# Patient Record
Sex: Female | Born: 2000
Health system: Southern US, Community
[De-identification: ages and names within clinical notes are randomized; demographics above are authoritative.]

## PROBLEM LIST (undated history)

## (undated) DIAGNOSIS — R109 Unspecified abdominal pain: Secondary | ICD-10-CM

## (undated) DIAGNOSIS — T7840XA Allergy, unspecified, initial encounter: Secondary | ICD-10-CM

## (undated) HISTORY — PX: OTHER SURGICAL HISTORY: SHX169

## (undated) HISTORY — DX: Allergy, unspecified, initial encounter: T78.40XA

## (undated) HISTORY — DX: Unspecified abdominal pain: R10.9

---
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
## 2021-02-20 ENCOUNTER — Encounter: Payer: Self-pay | Admitting: Family

## 2021-02-20 ENCOUNTER — Other Ambulatory Visit: Payer: Self-pay

## 2021-02-20 ENCOUNTER — Telehealth: Payer: Self-pay

## 2021-02-20 NOTE — Telephone Encounter (Signed)
DPL sent two links for Mychart Video appointment on 02/20/2021 with no response. Called patient twice and LM

## 2021-02-21 NOTE — Progress Notes (Signed)
This encounter was created in error - please disregard.

## 2021-02-28 ENCOUNTER — Telehealth: Payer: BC Managed Care – PPO | Admitting: Family

## 2021-03-14 DIAGNOSIS — L03317 Cellulitis of buttock: Secondary | ICD-10-CM | POA: Diagnosis not present

## 2021-04-10 ENCOUNTER — Encounter: Payer: Self-pay | Admitting: Family Medicine

## 2021-04-10 ENCOUNTER — Other Ambulatory Visit: Payer: Self-pay

## 2021-04-10 ENCOUNTER — Ambulatory Visit (INDEPENDENT_AMBULATORY_CARE_PROVIDER_SITE_OTHER): Payer: BC Managed Care – PPO | Admitting: Family Medicine

## 2021-04-10 VITALS — BP 101/68 | HR 77 | Temp 98.3°F | Resp 16 | Ht 67.0 in | Wt 165.0 lb

## 2021-04-10 DIAGNOSIS — R109 Unspecified abdominal pain: Secondary | ICD-10-CM | POA: Diagnosis not present

## 2021-04-10 DIAGNOSIS — R140 Abdominal distension (gaseous): Secondary | ICD-10-CM

## 2021-04-10 NOTE — Assessment & Plan Note (Signed)
Chronic, intermittent. Obtain labs to assess for underlying pathology. Instructed on avoiding triggers and low FODMAP diet. Encouraged to keep food diary as well as documenting sleep and stress levels. F/u in 4 weeks. If labs unrevealing with persistent sx, consider GI referral.

## 2021-04-10 NOTE — Patient Instructions (Signed)
Low-FODMAP Eating Plan °FODMAP stands for fermentable oligosaccharides, disaccharides, monosaccharides, and polyols. These are sugars that are hard for some people to digest. A low-FODMAP eating plan may help some people who have irritable bowel syndrome (IBS) and certain other bowel (intestinal) diseases to manage their symptoms. °This meal plan can be complicated to follow. Work with a diet and nutrition specialist (dietitian) to make a low-FODMAP eating plan that is right for you. A dietitian can help make sure that you get enough nutrition from this diet. °What are tips for following this plan? °Reading food labels °Check labels for hidden FODMAPs such as: °High-fructose syrup. °Honey. °Agave. °Natural fruit flavors. °Onion or garlic powder. °Choose low-FODMAP foods that contain 3-4 grams of fiber per serving. °Check food labels for serving sizes. Eat only one serving at a time to make sure FODMAP levels stay low. °Shopping °Shop with a list of foods that are recommended on this diet and make a meal plan. °Meal planning °Follow a low-FODMAP eating plan for up to 6 weeks, or as told by your health care provider or dietitian. °To follow the eating plan: °Eliminate high-FODMAP foods from your diet completely. Choose only low-FODMAP foods to eat. You will do this for 2-6 weeks. °Gradually reintroduce high-FODMAP foods into your diet one at a time. Most people should wait a few days before introducing the next new high-FODMAP food into their meal plan. Your dietitian can recommend how quickly you may reintroduce foods. °Keep a daily record of what and how much you eat and drink. Make note of any symptoms that you have after eating. °Review your daily record with a dietitian regularly to identify which foods you can eat and which foods you should avoid. °General tips °Drink enough fluid each day to keep your urine pale yellow. °Avoid processed foods. These often have added sugar and may be high in FODMAPs. °Avoid most  dairy products, whole grains, and sweeteners. °Work with a dietitian to make sure you get enough fiber in your diet. °Avoid high FODMAP foods at meals to manage symptoms. °Recommended foods °Fruits °Bananas, oranges, tangerines, lemons, limes, blueberries, raspberries, strawberries, grapes, cantaloupe, honeydew melon, kiwi, papaya, passion fruit, and pineapple. Limited amounts of dried cranberries, banana chips, and shredded coconut. °Vegetables °Eggplant, zucchini, cucumber, peppers, green beans, bean sprouts, lettuce, arugula, kale, Swiss chard, spinach, collard greens, bok choy, summer squash, potato, and tomato. Limited amounts of corn, carrot, and sweet potato. Green parts of scallions. °Grains °Gluten-free grains, such as rice, oats, buckwheat, quinoa, corn, polenta, and millet. Gluten-free pasta, bread, or cereal. Rice noodles. Corn tortillas. °Meats and other proteins °Unseasoned beef, pork, poultry, or fish. Eggs. Bacon. Tofu (firm) and tempeh. Limited amounts of nuts and seeds, such as almonds, walnuts, brazil nuts, pecans, peanuts, nut butters, pumpkin seeds, chia seeds, and sunflower seeds. °Dairy °Lactose-free milk, yogurt, and kefir. Lactose-free cottage cheese and ice cream. Non-dairy milks, such as almond, coconut, hemp, and rice milk. Non-dairy yogurt. Limited amounts of goat cheese, brie, mozzarella, parmesan, swiss, and other hard cheeses. °Fats and oils °Butter-free spreads. Vegetable oils, such as olive, canola, and sunflower oil. °Seasoning and other foods °Artificial sweeteners with names that do not end in "ol," such as aspartame, saccharine, and stevia. Maple syrup, white table sugar, raw sugar, brown sugar, and molasses. Mayonnaise, soy sauce, and tamari. Fresh basil, coriander, parsley, rosemary, and thyme. °Beverages °Water and mineral water. Sugar-sweetened soft drinks. Small amounts of orange juice or cranberry juice. Black and green tea. Most dry wines. Coffee. °  The items listed above  may not be a complete list of foods and beverages you can eat. Contact a dietitian for more information. °Foods to avoid °Fruits °Fresh, dried, and juiced forms of apple, pear, watermelon, peach, plum, cherries, apricots, blackberries, boysenberries, figs, nectarines, and mango. Avocado. °Vegetables °Chicory root, artichoke, asparagus, cabbage, snow peas, Brussels sprouts, broccoli, sugar snap peas, mushrooms, celery, and cauliflower. Onions, garlic, leeks, and the white part of scallions. °Grains °Wheat, including kamut, durum, and semolina. Barley and bulgur. Couscous. Wheat-based cereals. Wheat noodles, bread, crackers, and pastries. °Meats and other proteins °Fried or fatty meat. Sausage. Cashews and pistachios. Soybeans, baked beans, black beans, chickpeas, kidney beans, fava beans, navy beans, lentils, black-eyed peas, and split peas. °Dairy °Milk, yogurt, ice cream, and soft cheese. Cream and sour cream. Milk-based sauces. Custard. Buttermilk. Soy milk. °Seasoning and other foods °Any sugar-free gum or candy. Foods that contain artificial sweeteners such as sorbitol, mannitol, isomalt, or xylitol. Foods that contain honey, high-fructose corn syrup, or agave. Bouillon, vegetable stock, beef stock, and chicken stock. Garlic and onion powder. Condiments made with onion, such as hummus, chutney, pickles, relish, salad dressing, and salsa. Tomato paste. °Beverages °Chicory-based drinks. Coffee substitutes. Chamomile tea. Fennel tea. Sweet or fortified wines such as port or sherry. Diet soft drinks made with isomalt, mannitol, maltitol, sorbitol, or xylitol. Apple, pear, and mango juice. Juices with high-fructose corn syrup. °The items listed above may not be a complete list of foods and beverages you should avoid. Contact a dietitian for more information. °Summary °FODMAP stands for fermentable oligosaccharides, disaccharides, monosaccharides, and polyols. These are sugars that are hard for some people to  digest. °A low-FODMAP eating plan is a short-term diet that helps to ease symptoms of certain bowel diseases. °The eating plan usually lasts up to 6 weeks. After that, high-FODMAP foods are reintroduced gradually and one at a time. This can help you find out which foods may be causing symptoms. °A low-FODMAP eating plan can be complicated. It is best to work with a dietitian who has experience with this type of plan. °This information is not intended to replace advice given to you by your health care provider. Make sure you discuss any questions you have with your health care provider. °Document Revised: 08/06/2019 Document Reviewed: 08/06/2019 °Elsevier Patient Education © 2022 Elsevier Inc. ° °

## 2021-04-10 NOTE — Progress Notes (Signed)
° °  SUBJECTIVE:   CHIEF COMPLAINT / HPI:   ABDOMINAL ISSUES  Duration:  5-6 years Nature: bloating Location: diffuse  Frequency: intermittent, occurring at least 3x per week.  Alleviating factors: herbal teas Aggravating factors:dairy, bread, sugars Treatments attempted: herbal teas Constipation: no Diarrhea: no 1 BM per day, "normal" Episodes of diarrhea/day: Mucous in the stool: no Heartburn: occasional Bloating:yes Flatulence: no Nausea: yes Vomiting: no Episodes of vomit/day: Melena or hematochezia: no Rash: no Jaundice: no Fever: no Weight loss: no No FH of GI issues.  No abdominal surgeries.  Previously on prilosec for acid reflux.  OBJECTIVE:   BP 101/68 (BP Location: Right Arm, Patient Position: Sitting, Cuff Size: Normal)    Pulse 77    Temp 98.3 F (36.8 C) (Temporal)    Resp 16    Ht 5\' 7"  (1.702 m)    Wt 165 lb (74.8 kg)    LMP 04/10/2021    SpO2 99%    BMI 25.84 kg/m   Gen: well appearing, in NAD Card: RRR Lungs: CTAB Abd: soft, NTND, +BS. Negative murphy sign. No organomegaly.  Ext: WWP, no edema   ASSESSMENT/PLAN:   Abdominal bloating with cramps Chronic, intermittent. Obtain labs to assess for underlying pathology. Instructed on avoiding triggers and low FODMAP diet. Encouraged to keep food diary as well as documenting sleep and stress levels. F/u in 4 weeks. If labs unrevealing with persistent sx, consider GI referral.      Myles Gip, DO

## 2021-04-12 LAB — COMPREHENSIVE METABOLIC PANEL
ALT: 21 IU/L (ref 0–32)
AST: 24 IU/L (ref 0–40)
Albumin/Globulin Ratio: 2 (ref 1.2–2.2)
Albumin: 4.3 g/dL (ref 3.9–5.0)
Alkaline Phosphatase: 74 IU/L (ref 42–106)
BUN/Creatinine Ratio: 21 (ref 9–23)
BUN: 13 mg/dL (ref 6–20)
Bilirubin Total: 0.2 mg/dL (ref 0.0–1.2)
CO2: 25 mmol/L (ref 20–29)
Calcium: 9.2 mg/dL (ref 8.7–10.2)
Chloride: 102 mmol/L (ref 96–106)
Creatinine, Ser: 0.62 mg/dL (ref 0.57–1.00)
Globulin, Total: 2.2 g/dL (ref 1.5–4.5)
Glucose: 79 mg/dL (ref 70–99)
Potassium: 4.2 mmol/L (ref 3.5–5.2)
Sodium: 140 mmol/L (ref 134–144)
Total Protein: 6.5 g/dL (ref 6.0–8.5)
eGFR: 131 mL/min/{1.73_m2} (ref >59)

## 2021-04-12 LAB — CBC WITH DIFFERENTIAL/PLATELET
Basophils Absolute: 0 10*3/uL (ref 0.0–0.2)
Basos: 1 %
EOS (ABSOLUTE): 0.2 10*3/uL (ref 0.0–0.4)
Eos: 4 %
Hematocrit: 38.1 % (ref 34.0–46.6)
Hemoglobin: 12.2 g/dL (ref 11.1–15.9)
Immature Grans (Abs): 0 10*3/uL (ref 0.0–0.1)
Immature Granulocytes: 0 %
Lymphocytes Absolute: 1.3 10*3/uL (ref 0.7–3.1)
Lymphs: 32 %
MCH: 25.2 pg — ABNORMAL LOW (ref 26.6–33.0)
MCHC: 32 g/dL (ref 31.5–35.7)
MCV: 79 fL (ref 79–97)
Monocytes Absolute: 0.4 10*3/uL (ref 0.1–0.9)
Monocytes: 10 %
Neutrophils Absolute: 2.2 10*3/uL (ref 1.4–7.0)
Neutrophils: 53 %
Platelets: 228 10*3/uL (ref 150–450)
RBC: 4.84 x10E6/uL (ref 3.77–5.28)
RDW: 16.6 % — ABNORMAL HIGH (ref 11.7–15.4)
WBC: 4.2 10*3/uL (ref 3.4–10.8)

## 2021-04-12 LAB — CELIAC DISEASE PANEL
Endomysial IgA: NEGATIVE
IgA/Immunoglobulin A, Serum: 121 mg/dL (ref 87–352)
Transglutaminase IgA: <2 U/mL (ref 0–3)

## 2021-04-12 LAB — H. PYLORI BREATH TEST: H pylori Breath Test: NEGATIVE

## 2021-04-12 LAB — TSH: TSH: 0.568 u[IU]/mL (ref 0.450–4.500)

## 2021-04-14 ENCOUNTER — Other Ambulatory Visit: Payer: Self-pay

## 2021-04-14 MED ORDER — METHYLPHENIDATE HCL ER (OSM) 18 MG PO TBCR: EXTENDED_RELEASE_TABLET | ORAL | 0 refills | Status: DC

## 2021-04-14 NOTE — Telephone Encounter (Signed)
Concerta 18 mg daily, # 30 with no RF's.RX for above e-scribed and sent to pharmacy on record  [facility] - [place], [place] - [address] [place] [address] Phone: [phone] Fax: [phone]      

## 2021-04-25 ENCOUNTER — Encounter: Payer: Self-pay | Admitting: Family

## 2021-04-25 ENCOUNTER — Telehealth (INDEPENDENT_AMBULATORY_CARE_PROVIDER_SITE_OTHER): Payer: BC Managed Care – PPO | Admitting: Family

## 2021-04-25 ENCOUNTER — Other Ambulatory Visit: Payer: Self-pay

## 2021-04-25 DIAGNOSIS — F819 Developmental disorder of scholastic skills, unspecified: Secondary | ICD-10-CM

## 2021-04-25 DIAGNOSIS — Z719 Counseling, unspecified: Secondary | ICD-10-CM

## 2021-04-25 DIAGNOSIS — F900 Attention-deficit hyperactivity disorder, predominantly inattentive type: Secondary | ICD-10-CM | POA: Diagnosis not present

## 2021-04-25 DIAGNOSIS — F411 Generalized anxiety disorder: Secondary | ICD-10-CM

## 2021-04-25 DIAGNOSIS — Z79899 Other long term (current) drug therapy: Secondary | ICD-10-CM | POA: Diagnosis not present

## 2021-04-25 DIAGNOSIS — Z8659 Personal history of other mental and behavioral disorders: Secondary | ICD-10-CM | POA: Diagnosis not present

## 2021-04-25 DIAGNOSIS — Z7189 Other specified counseling: Secondary | ICD-10-CM

## 2021-04-25 NOTE — Progress Notes (Signed)
Sandra Simmons [place]. [address] [place] [address] Dept: [phone] Dept Fax: [phone]  Medication Check visit via Virtual Video   Patient ID:  Sandra Simmons  female DOB: Dec 22, 2000   21 y.o.   MRN: [id number]   DATE:04/25/21  PCP: Virginia Crews, MD  Virtual Visit via Video Note  I connected with  Sandra Simmons on 04/25/21 at  9:30 AM EST by a video enabled telemedicine application and verified that I am speaking with the correct person using two identifiers. Patient/Parent Location: at home   I discussed the limitations, risks, security and privacy concerns of performing an evaluation and management service by telephone and the availability of in person appointments. I also discussed with the parents that there may be a patient responsible charge related to this service. The parents expressed understanding and agreed to proceed.  Provider: Carolann Littler, NP  Location: work location  HPI/CURRENT STATUS: Sandra Simmons is here for medication management of the psychoactive medications for ADHD and review of educational and behavioral concerns.   Sandra Simmons currently taking Concerta 18 mg daily, which is working well. Takes medication in the morning before class. Medication tends to wear off around the evening time. Sandra Simmons is able to focus through school/homework.   Sandra Simmons is eating well (eating breakfast, lunch and dinner). Sandra Simmons does not have appetite suppression and getting enough calories in each day.   Sleeping well (goes to bed at 12:00 am wakes at 7-9:00 am), sleeping through the night. Sandra Simmons does not have delayed sleep onset  EDUCATION: School: [employer] Year/Grade:  College   Performance/ Grades:  GPA 3.7 with 18 hours Services: Other: none reported. Tutoring for science and math  Activities/ Exercise: Dance club for several hours each week and showcase coming up. Dance class last semester with  healthcare professions club on campus. Gym in the morning daily.   MEDICAL HISTORY: Individual Medical History/ Review of Systems: Yes, PCP with blood work with allergy test. Needing to take Fe+ 3 times weekly.   Has been healthy with no visits to the PCP. Well check up due yearly.   Family Medical/ Social History: Changes? None reported Patient Lives with:  3 roommates  MENTAL HEALTH: Mental Health Issues: Anxiety-less now and more organized   Allergies: No Known Allergies  Current Medications:  Current Outpatient Medications  Medication Instructions   drospirenone-ethinyl estradiol (YAZ) 3-0.02 MG tablet 1 tablet, Oral, Daily   hydrocortisone 1 % ointment 1 application, 2 times daily   methylphenidate (RITALIN) 5 mg, Oral, Every evening   methylphenidate 18 MG PO CR tablet TAKE 1 TABLET BY MOUTH ONCE EVERY MORNING   tazarotene (AVAGE) 0.1 % cream Topical   Medication Side Effects: None  DIAGNOSES:    ICD-10-CM   1. ADHD (attention deficit hyperactivity disorder), inattentive type  F90.0     2. GAD (generalized anxiety disorder)  F41.1     3. History of depression  Z86.59     4. Medication management  Z79.899     5. Learning difficulty  F81.9     6. Patient counseled  Z71.9     7. Goals of care, counseling/discussion  Z71.89      ASSESSMENT:  Zaphira is a 21 year old female with a history of ADHD, Anxiety and learning difficulties. She has been well controlled on her Concerta 18 mg daily with using the Ritalin 5 mg in the afternoon. Anxiety has been less  with no current medication or counseling in place. Academically doing well with possible change in major to nutrition. No formal services in place at school. Getting tutoring as needed for science and math during office hours. Back to dancing and participating in the dance club. Also is part of the healthcare professions club at school. Going to the gym on a regular basis and eating enough calories during the day. Sleeping  well with no current concerns. NO reported health changes in the last f/u visit. Will continue with her Concerta and Ritalin with no changes today.   PLAN/RECOMMENDATIONS:  Updates from patient regarding school, health, family and medical changes since the last visit provided today.   No formal services in place at school for learning or attention. Supported tutoring and office hours for extra help with learning due to level of difficulty of classes.  Support given for continuation of dance and regular exercise daily. Encouraged good protein and calories during the day to support activity level along with staying hydrated.  More social interactions with clubs and dancing on campus. Provided positive feedback to current enrollment with on campus activities.   Anxiety has been less with adjustment to school and better executive functioning this semester.   Sleep hygiene and sleep schedule has continued with no issues with sleeping reported today.   Counseled medication pharmacokinetics, options, dosage, administration, desired effects, and possible side effects.   Concerta 18 mg daily, no Rx today Ritalin 5 mg in the evening PRN, no Rx today  I discussed the assessment and treatment plan with the patient. The patient was provided an opportunity to ask questions and all were answered. The patient agreed with the plan and demonstrated an understanding of the instructions.   NEXT APPOINTMENT:  08/17/2021-routine f/u visit Telehealth OK  The patient was advised to call back or seek an in-person evaluation if the symptoms worsen or if the condition fails to improve as anticipated.   Carolann Littler, NP

## 2021-05-30 ENCOUNTER — Telehealth: Payer: BC Managed Care – PPO | Admitting: Family

## 2021-06-21 ENCOUNTER — Other Ambulatory Visit: Payer: Self-pay

## 2021-06-21 ENCOUNTER — Telehealth: Payer: Self-pay

## 2021-06-21 MED ORDER — METHYLPHENIDATE HCL ER (OSM) 18 MG PO TBCR: EXTENDED_RELEASE_TABLET | ORAL | 0 refills | Status: DC

## 2021-06-21 NOTE — Telephone Encounter (Signed)
Concerta 18 mg daily, # 30 with no RF's.RX for above e-scribed and sent to pharmacy on record  [id number] - [place], [place] - [address] [place] [address] Phone: [phone] Fax: [phone]    

## 2021-06-22 NOTE — Telephone Encounter (Signed)
Outcome ?Approvedon March 22 ?Your PA request has been approved. Additional information will be provided in the approval communication. (Message 1145) ?

## 2021-06-25 DIAGNOSIS — M25572 Pain in left ankle and joints of left foot: Secondary | ICD-10-CM | POA: Diagnosis not present

## 2021-06-25 DIAGNOSIS — S93402A Sprain of unspecified ligament of left ankle, initial encounter: Secondary | ICD-10-CM | POA: Diagnosis not present

## 2021-06-25 DIAGNOSIS — S99912A Unspecified injury of left ankle, initial encounter: Secondary | ICD-10-CM | POA: Diagnosis not present

## 2021-06-25 DIAGNOSIS — X58XXXA Exposure to other specified factors, initial encounter: Secondary | ICD-10-CM | POA: Diagnosis not present

## 2021-07-19 DIAGNOSIS — Z130 Encounter for screening for diseases of the blood and blood-forming organs and certain disorders involving the immune mechanism: Secondary | ICD-10-CM | POA: Diagnosis not present

## 2021-07-21 DIAGNOSIS — Z025 Encounter for examination for participation in sport: Secondary | ICD-10-CM | POA: Diagnosis not present

## 2021-07-31 ENCOUNTER — Other Ambulatory Visit: Payer: Self-pay

## 2021-07-31 MED ORDER — METHYLPHENIDATE HCL ER (OSM) 18 MG PO TBCR: EXTENDED_RELEASE_TABLET | ORAL | 0 refills | Status: DC

## 2021-07-31 NOTE — Telephone Encounter (Signed)
Concerta 18 mg daily, # 30 with no RF's.RX for above e-scribed and sent to pharmacy on record  [id number] - [place], [place] - [address] [place] [address] Phone: [phone] Fax: [phone]    

## 2021-08-17 ENCOUNTER — Ambulatory Visit (INDEPENDENT_AMBULATORY_CARE_PROVIDER_SITE_OTHER): Payer: BC Managed Care – PPO | Admitting: Family

## 2021-08-17 ENCOUNTER — Encounter: Payer: Self-pay | Admitting: Family

## 2021-08-17 VITALS — BP 112/64 | HR 68 | Resp 16 | Ht 68.0 in | Wt 157.8 lb

## 2021-08-17 DIAGNOSIS — R41840 Attention and concentration deficit: Secondary | ICD-10-CM | POA: Diagnosis not present

## 2021-08-17 DIAGNOSIS — F900 Attention-deficit hyperactivity disorder, predominantly inattentive type: Secondary | ICD-10-CM

## 2021-08-17 DIAGNOSIS — Z7189 Other specified counseling: Secondary | ICD-10-CM

## 2021-08-17 DIAGNOSIS — Z8659 Personal history of other mental and behavioral disorders: Secondary | ICD-10-CM

## 2021-08-17 DIAGNOSIS — Z79899 Other long term (current) drug therapy: Secondary | ICD-10-CM

## 2021-08-17 DIAGNOSIS — F411 Generalized anxiety disorder: Secondary | ICD-10-CM | POA: Diagnosis not present

## 2021-08-17 DIAGNOSIS — Z719 Counseling, unspecified: Secondary | ICD-10-CM

## 2021-08-17 MED ORDER — BUSPIRONE HCL 5 MG PO TABS: 5.0000 mg | ORAL_TABLET | Freq: Every day | ORAL | 0 refills | Status: AC

## 2021-08-17 MED ORDER — METHYLPHENIDATE HCL ER (OSM) 18 MG PO TBCR: EXTENDED_RELEASE_TABLET | ORAL | 0 refills | Status: DC

## 2021-08-17 NOTE — Patient Instructions (Signed)
Counseling  [facility]  [facility]

## 2021-08-17 NOTE — Progress Notes (Signed)
[facility] [address] [place] [address] Dept: [phone] Dept Fax: [phone] Loc: [phone] Loc Fax: [phone]  Medication Check  Patient ID: Sandra Simmons, female  DOB: [birth year], 21 y.o.  MRN: [id number]  Date of Evaluation: 08/17/2021 PCP: Erasmo Downer, MD  Accompanied by: Mother Patient Lives with: parents  HISTORY/CURRENT STATUS: HPI Patient is here today for the visit with her mother. Patient is interactive and appropriate with provider. Last f/u visit on 04/25/2021 via video call with patient. Patient academically did well but having an increased amount of anxiety recently with several different situations. Sandra Simmons has continued with Concerta 18 mg in the morning and IR Ritalin in the afternoon PRN for her ability to focus with no concerns.  EDUCATION: School: [employer]  Year/Grade:  college 2 online classes this summer Performance/ Grades: above average Services: none reported, tutoring as needed Activities/ Exercise:  did not try out for dance team   MEDICAL HISTORY: Appetite: Good  MVI/Other: Fe+, calcium, Vitamin D and MVI daily.   Sleep: Getting some sleep at school, but now home and on a better schedule. Concerns: Anxiety can cause some initiation difficultness. Initiation/Maintenance/Other: none reported right now.  Individual Medical History/ Review of Systems: Changes? :Yes, more anxiety recently and stomach issues.   Allergies: Patient has no known allergies.  Current Medications:  Current Outpatient Medications  Medication Instructions   busPIRone (BUSPAR) 5 mg, Oral, Daily   drospirenone-ethinyl estradiol (YAZ) 3-0.02 MG tablet 1 tablet, Oral, Daily   hydrocortisone 1 % ointment 1 application., 2 times daily   methylphenidate (RITALIN) 5 mg, Oral, Every evening   methylphenidate 18 MG PO CR tablet TAKE 1  TABLET BY MOUTH ONCE EVERY MORNING   tazarotene (AVAGE) 0.1 % cream Topical   Medication Side Effects: None Family Medical/ Social History: Changes? None  MENTAL HEALTH: Mental Health Issues: Anxiety  PHYSICAL EXAM; Vitals:  Vitals:   08/17/21 1026  BP: 112/64  Pulse: 68  Resp: 16  Weight: 157 lb 12.8 oz (71.6 kg)  Height: 5\' 8"  (1.727 m)    General Physical Exam: Unchanged from previous exam, date:04/25/2021 Changed:None  DIAGNOSES:    ICD-10-CM   1. ADHD (attention deficit hyperactivity disorder), inattentive type  F90.0     2. GAD (generalized anxiety disorder)  F41.1     3. Poor concentration  R41.840     4. History of depression  Z86.59     5. History of anxiety  Z86.59     6. Medication management  Z79.899     7. Patient counseled  Z71.9     8. Goals of care, counseling/discussion  Z71.89      ASSESSMENT: Sandra Simmons is a  21 year old female with a hisotry of ADHD, Anxiety and Depression. She has been maintained on Concerta 18 mg for day time and IR Ritalin for evenings PRN with good efficacy. Academically doing well with no formal services in place. Sandra Simmons is reporting more anxiety and recent incident that caused a panic attack. Patient has a history of anxiety, but not on any medication or in therapy. Some changes with eating related to stomach issues and has f/u regarding this concern. Dancing and exercising on a regular basis. Eating well most of the day. Sleep good for most of the night .  Will address anxiety with options for therapy and treatment with patient.  RECOMMENDATIONS:  School, academics, progress and  major discussed with patient.   Options for healthcare majors with some difficultness with chemistry discussed and support given to Sandra Simmons.   Information regarding careers and jobs provided with a few suggestions provided for further investigation.   Recent incident at school with dance tryout triggered an increased amount of anxiety and emotional response  with "shutting down" in response.   Strongly encouraged to f/u with counselor due to her anxiety and need for assistance with CBT for emotional dysregulation.   Provided 2 local therapy groups for mother and patient to research and make appt with a provider to assist with her anxiety.   Discussed medication management for her current symptoms and treatment options for her anxiety.   Counseled medication pharmacokinetics, options, dosage, administration, desired effects, and possible side effects.   Concerta 18 mg daily, # 30 with no RF"s Ritalin 5 mg in the evening PRN, no Rx today  Start Buspar 5 mg daily # 30 with no RF's.RX for above e-scribed and sent to pharmacy on record [facility] [place]  I discussed the assessment and treatment plan with the patient. The patient was provided an opportunity to ask questions and all were answered. The patient agreed with the plan and demonstrated an understanding of the instructions.  NEXT APPOINTMENT: Return in about 3 months (around 11/17/2021) for f/u visit .  The patient was advised to call back or seek an in-person evaluation if the symptoms worsen or if the condition fails to improve as anticipated.  Sandra Littler, NP

## 2021-08-19 ENCOUNTER — Encounter: Payer: Self-pay | Admitting: Family

## 2021-08-24 ENCOUNTER — Other Ambulatory Visit: Payer: Self-pay

## 2021-08-24 MED ORDER — METHYLPHENIDATE HCL ER (OSM) 18 MG PO TBCR: EXTENDED_RELEASE_TABLET | ORAL | 0 refills | Status: DC

## 2021-08-24 NOTE — Telephone Encounter (Signed)
Concerta 18 mg daily # 30 with no RF's.RX for above e-scribed and sent to pharmacy on record  [id number] - [place], [place] - [address] [place] [address] Phone: [phone] Fax: [phone]

## 2021-09-05 ENCOUNTER — Other Ambulatory Visit: Payer: Self-pay | Admitting: Family Medicine

## 2021-09-05 NOTE — Telephone Encounter (Signed)
Requested medication (s) are due for refill today: yes  Requested medication (s) are on the active medication list: yes  Last refill:  03/22/20  Future visit scheduled: yes  Notes to clinic:  historical med. Please assess     Requested Prescriptions  Pending Prescriptions Disp Refills   drospirenone-ethinyl estradiol (YAZ) 3-0.02 MG tablet 28 tablet     Sig: Take 1 tablet by mouth daily.     OB/GYN:  Contraceptives Passed - 09/05/2021  2:17 PM      Passed - Last BP in normal range    BP Readings from Last 1 Encounters:  04/10/21 101/68         Passed - Valid encounter within last 12 months    Recent Outpatient Visits           4 months ago Bloating   [facility] Caro Laroche, DO   2 years ago Encounter for routine child health examination without abnormal findings   [facility], Marzella Schlein, MD   2 years ago GAD (generalized anxiety disorder)   [facility], Marzella Schlein, MD   2 years ago Generalized abdominal pain   [facility] [place], Marzella Schlein, MD   2 years ago Need for meningococcal vaccination   [facility], Marzella Schlein, MD       Future Appointments             In 1 month Bacigalupo, Marzella Schlein, MD [facility]             Passed - Patient is not a smoker

## 2021-09-05 NOTE — Telephone Encounter (Signed)
Medication Refill - Medication: NIKKI 3-0.02 MG tablet   Pt stated she has not taken since late in 2022 because she stopped taking but would like a refill.   Has the patient contacted their pharmacy? No. No, just stopped receiving medication in the mail.   (Agent: If no, request that the patient contact the pharmacy for the refill. If patient does not wish to contact the pharmacy document the reason why and proceed with request.)   Preferred Pharmacy (with phone number or street name):  [facility] - [place], [place] [address]  Phone: [phone] Fax: [phone]  Hours: Not open 24 hours   Has the patient been seen for an appointment in the last year OR does the patient have an upcoming appointment? Yes.    Agent: Please be advised that RX refills may take up to 3 business days. We ask that you follow-up with your pharmacy.

## 2021-10-10 ENCOUNTER — Encounter: Payer: BC Managed Care – PPO | Admitting: Family Medicine

## 2021-10-10 ENCOUNTER — Encounter: Payer: BC Managed Care – PPO | Admitting: Physician Assistant

## 2021-10-10 DIAGNOSIS — F411 Generalized anxiety disorder: Secondary | ICD-10-CM

## 2021-10-10 DIAGNOSIS — R109 Unspecified abdominal pain: Secondary | ICD-10-CM

## 2021-10-10 DIAGNOSIS — Z0000 Encounter for general adult medical examination without abnormal findings: Secondary | ICD-10-CM

## 2021-10-10 DIAGNOSIS — F900 Attention-deficit hyperactivity disorder, predominantly inattentive type: Secondary | ICD-10-CM

## 2021-10-11 ENCOUNTER — Other Ambulatory Visit: Payer: Self-pay | Admitting: Family Medicine

## 2021-10-12 MED ORDER — DROSPIRENONE-ETHINYL ESTRADIOL 3-0.02 MG PO TABS: 1.0000 | ORAL_TABLET | Freq: Every day | ORAL | 0 refills | Status: DC

## 2021-10-26 ENCOUNTER — Encounter: Payer: Self-pay | Admitting: Family Medicine

## 2021-10-26 ENCOUNTER — Encounter: Payer: Self-pay | Admitting: Family

## 2021-11-06 ENCOUNTER — Other Ambulatory Visit: Payer: Self-pay | Admitting: Physician Assistant

## 2021-11-06 DIAGNOSIS — L209 Atopic dermatitis, unspecified: Secondary | ICD-10-CM | POA: Diagnosis not present

## 2021-11-06 DIAGNOSIS — L700 Acne vulgaris: Secondary | ICD-10-CM | POA: Diagnosis not present

## 2021-11-15 ENCOUNTER — Other Ambulatory Visit: Payer: Self-pay | Admitting: Family Medicine

## 2021-11-15 DIAGNOSIS — Z789 Other specified health status: Secondary | ICD-10-CM

## 2021-11-15 NOTE — Telephone Encounter (Signed)
Medication Refill - Medication: drospirenone-ethinyl estradiol (YAZ) 3-0.02 MG tablet  Has the patient contacted their pharmacy? Yes.    (Agent: If yes, when and what did the pharmacy advise?) No refills   Preferred Pharmacy (with phone number or street name):  [facility] - [place], [place] - [address] [facility] [address] [facility] Phone:  [phone]  Fax:  [phone]       Has the patient been seen for an appointment in the last year OR does the patient have an upcoming appointment? Yes.    Agent: Please be advised that RX refills may take up to 3 business days. We ask that you follow-up with your pharmacy.

## 2021-11-16 NOTE — Telephone Encounter (Signed)
Requested medication (s) are due for refill today: Yes  Requested medication (s) are on the active medication list: Yes  Last refill:  10/12/21  Future visit scheduled: Yes  Notes to clinic:  Unable to refill per protocol, appointment needed and scheduled, unsure if refills could be sent until appt, routing to provider for approval, see notes below.     Requested Prescriptions  Pending Prescriptions Disp Refills   drospirenone-ethinyl estradiol (YAZ) 3-0.02 MG tablet 28 tablet 0    Sig: Take 1 tablet by mouth daily. Please schedule office visit before any future refill.     OB/GYN:  Contraceptives Passed - 11/15/2021  3:18 PM      Passed - Last BP in normal range    BP Readings from Last 1 Encounters:  04/10/21 101/68         Passed - Valid encounter within last 12 months    Recent Outpatient Visits           7 months ago Bloating   [facility] Caro Laroche, DO   2 years ago Encounter for routine child health examination without abnormal findings   [facility], Marzella Schlein, MD   2 years ago GAD (generalized anxiety disorder)   [facility], Marzella Schlein, MD   2 years ago Generalized abdominal pain   [facility], Marzella Schlein, MD   3 years ago Need for meningococcal vaccination   [facility], Marzella Schlein, MD       Future Appointments             In 3 months Bac[facility], Marzella Schlein, MD [facility]            Passed - Patient is not a smoker

## 2021-11-16 NOTE — Telephone Encounter (Signed)
Patient's mother called and advised an appointment is needed for refills of birth control. She says her daughter just left to go back to college, so unsure when she'll be able to come to the office. I advised the earliest physical for provider is in December and if she could come on her break at that time, she says that would be better. Scheduled for 03/15/22 with Dr. Beryle Flock. Mom asked if the medication could be refilled until her appointment, I advised I will send this request to Dr. B and if she's is not going to refill, someone will call to advise. She says the patient has 1 month supply left then will be out.

## 2021-11-20 MED ORDER — DROSPIRENONE-ETHINYL ESTRADIOL 3-0.02 MG PO TABS: 1.0000 | ORAL_TABLET | Freq: Every day | ORAL | 5 refills | Status: AC

## 2021-12-05 ENCOUNTER — Other Ambulatory Visit: Payer: Self-pay

## 2021-12-05 MED ORDER — METHYLPHENIDATE HCL ER (OSM) 18 MG PO TBCR: EXTENDED_RELEASE_TABLET | ORAL | 0 refills | Status: DC

## 2021-12-05 NOTE — Telephone Encounter (Signed)
Concerta 18 mg daily, #30 with no RF's.RX for above e-scribed and sent to pharmacy on record  [id number] [place] [place] - [address] [place] [address] Phone: [phone] Fax: [phone]     

## 2021-12-11 ENCOUNTER — Encounter: Payer: Self-pay | Admitting: Family

## 2021-12-11 ENCOUNTER — Telehealth (INDEPENDENT_AMBULATORY_CARE_PROVIDER_SITE_OTHER): Payer: BC Managed Care – PPO | Admitting: Family

## 2021-12-11 DIAGNOSIS — F411 Generalized anxiety disorder: Secondary | ICD-10-CM | POA: Diagnosis not present

## 2021-12-11 DIAGNOSIS — Z719 Counseling, unspecified: Secondary | ICD-10-CM

## 2021-12-11 DIAGNOSIS — Z7189 Other specified counseling: Secondary | ICD-10-CM | POA: Diagnosis not present

## 2021-12-11 DIAGNOSIS — F900 Attention-deficit hyperactivity disorder, predominantly inattentive type: Secondary | ICD-10-CM | POA: Diagnosis not present

## 2021-12-11 DIAGNOSIS — R41840 Attention and concentration deficit: Secondary | ICD-10-CM | POA: Diagnosis not present

## 2021-12-11 DIAGNOSIS — R4589 Other symptoms and signs involving emotional state: Secondary | ICD-10-CM

## 2021-12-11 DIAGNOSIS — Z79899 Other long term (current) drug therapy: Secondary | ICD-10-CM

## 2021-12-11 NOTE — Progress Notes (Signed)
[facility] [address] [place] [place] [address] Dept: [phone] Dept Fax: [phone]  Medication Check visit via Virtual Video   Patient ID:  Sandra Simmons  female DOB: 05-02-2000   21 y.o.   MRN: [id number]   DATE:12/11/21  PCP: Erasmo Downer, MD  Virtual Visit via Video Note  I connected with  Sandra Simmons on 12/11/21 at  1:00 PM EDT by a video enabled telemedicine application and verified that I am speaking with the correct person using two identifiers. Patient/Parent Location: at school  I discussed the limitations, risks, security and privacy concerns of performing an evaluation and management service by telephone and the availability of in person appointments. I also discussed with the parents that there may be a patient responsible charge related to this service. The parents expressed understanding and agreed to proceed.  Provider: Carron Curie, NP  Location: private work location  HPI/CURRENT STATUS: Sandra Simmons is here for medication management of the psychoactive medications for ADHD and review of educational and behavioral concerns.   Danie currently taking Concerta  18 ng daily, which is working well. Takes medication daily as needed. Medication tends to wear off around evening time and can take her Ritalin 5 mg daily.Sandra Simmons is able to focus through school & homework.   Sandra Simmons is eating well (eating breakfast, lunch and dinner). Sandra Simmons does not have appetite suppression  Sleeping well (goes to bed at 2300 wakes at 0630), sleeping through the night. Sandra Simmons does not have delayed sleep onset  EDUCATION: School: [employer] for OT  Year/Grade:  college   Performance/ Grades: average Services: help is available and tutoring as needed.  Several clubs at school Activities/ Exercise: daily with the gym and dance.   MEDICAL HISTORY: Individual Medical History/ Review  of Systems: none reported  Has been healthy with no visits to the PCP. WCC due yearly. Wisdom teeth extraction for 2 teeth over fall break.   Family Medical/ Social History: None reported Patient Lives with:  roommates  MENTAL HEALTH: Mental Health Issues:  less anxiety recently but over the summer was depressed due to her relationship she was in this summer.      Allergies: No Known Allergies  Current Medications:  Current Outpatient Medications  Medication Instructions   busPIRone (BUSPAR) 5 mg, Oral, Daily   drospirenone-ethinyl estradiol (YAZ) 3-0.02 MG tablet 1 tablet, Oral, Daily, Please schedule office visit before any future refill.   hydrocortisone 1 % ointment 1 application , 2 times daily   methylphenidate (RITALIN) 5 mg, Oral, Every evening   methylphenidate 18 MG PO CR tablet TAKE 1 TABLET BY MOUTH ONCE EVERY MORNING   Medication Side Effects: None  DIAGNOSES:    ICD-10-CM   1. ADHD (attention deficit hyperactivity disorder), inattentive type  F90.0     2. GAD (generalized anxiety disorder)  F41.1     3. Depressed affect  R45.89     4. Poor concentration  R41.840     5. Medication management  Z79.899     6. Patient counseled  Z71.9     7. Goals of care, counseling/discussion  Z71.89      ASSESSMENT:      Sandra Simmons is a 21 year old female with a history of ADHD, Anxitey and Depressed mood. Had been well maintained on Concerns 18 mg daily with pm dose of Ritalin 5 mg for homework/study time with Buspar PRN 5  mg for anxiety. Sandra Simmons reports inconsistency with medication adherence with summer and noticed mood change off medication. Now with school back in session has been taking her Concerta daily. Mood is better and on a routine schedule. 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 Tutoring for chemistry is available. Has continued to exercise on a regular basis, eating well with enough calories and no medical changes.  Joined several clubs on campus to stay active and socialize. Recently got out of a difficult relationship and not receiving any counseling. Encouraged consistency with mediation and daily regimen.   PLAN/RECOMMENDATIONS:  Updates for school and recent change in major for exercise science with future plans for OT school.  Looking at options to apply in state for OT schooling after graduation and talked about 1st cohort at [employer].   No formal services in place at school with tutoring for classes needed with science.  Exercising on a regular basis with going to the gym and attending dance class.  Had been eating more then enough during the day with encouraging more protein with her calories.   Socializing more and has joining several clubs to assist with volunteering while at school.   Discussed some mood changes over the summer with negative relationship, but that has ended recently.    Sleeping better with good consistency each night.  Established bedtime routine and morning schedule.   Encouraged to seek counseling on campus to assist with recent break up for emotional support.   Medication management with current medication regiment with no changes.   Counseled medication pharmacokinetics, options, dosage, administration, desired effects, and possible side effects.   Concerta 18 mg daily, no Rx today Ritalin 5 mg in the evening time, PRN Buspar 5 mg daily, no Rx today   I discussed the assessment and treatment plan with the patient/parent. The patient/parent was provided an opportunity to ask questions and all were answered. The patient/ parent agreed with the plan and demonstrated an understanding of the instructions.   NEXT APPOINTMENT:  03/20/2022-f/u visit Telehealth OK  The patient was advised to call back or seek an in-person evaluation if the symptoms worsen or if the condition fails to improve as anticipated.  Carron Curie, NP

## 2022-01-05 ENCOUNTER — Other Ambulatory Visit: Payer: Self-pay

## 2022-01-05 MED ORDER — METHYLPHENIDATE HCL ER (OSM) 18 MG PO TBCR: EXTENDED_RELEASE_TABLET | ORAL | 0 refills | Status: DC

## 2022-01-05 NOTE — Telephone Encounter (Signed)
Concerta 18 mg daily, #30 with no RF's.RX for above e-scribed and sent to pharmacy on record  [id number] [place] [place] - [address] [place] [address] Phone: [phone] Fax: [phone]     

## 2022-02-28 ENCOUNTER — Other Ambulatory Visit: Payer: Self-pay

## 2022-02-28 MED ORDER — METHYLPHENIDATE HCL ER (OSM) 18 MG PO TBCR: EXTENDED_RELEASE_TABLET | ORAL | 0 refills | Status: DC

## 2022-02-28 NOTE — Telephone Encounter (Signed)
E-Prescribed Concerta 18 directly to  [facility]

## 2022-03-01 DIAGNOSIS — Z8349 Family history of other endocrine, nutritional and metabolic diseases: Secondary | ICD-10-CM | POA: Diagnosis not present

## 2022-03-13 NOTE — Progress Notes (Deleted)
I,Sulibeya S Dimas,acting as a Education administrator for Lavon Paganini, MD.,have documented all relevant documentation on the behalf of Lavon Paganini, MD,as directed by  Lavon Paganini, MD while in the presence of Lavon Paganini, MD.    Complete physical exam   [name]   DOB: 2000/05/08   21 y.o. Female  MRN: [id number] Visit Date: 03/15/2022  Today's healthcare provider: Lavon Paganini, MD   No chief complaint on file.  Subjective    Sandra Simmons is a 21 y.o. female who presents today for a complete physical exam.  She reports consuming a {diet types:17450} diet. {Exercise:19826} She generally feels {well/fairly well/poorly:18703}. She reports sleeping {well/fairly well/poorly:18703}. She {does/does not:200015} have additional problems to discuss today.  HPI    Past Medical History:  Diagnosis Date   Abdominal pain    Allergy    Past Surgical History:  Procedure Laterality Date   none     Social History   Socioeconomic History   Marital status: Single    Spouse name: Not on file   Number of children: Not on file   Years of education: 10   Highest education level: Not on file  Occupational History   Not on file  Tobacco Use   Smoking status: Never   Smokeless tobacco: Never  Vaping Use   Vaping Use: Never used  Substance and Sexual Activity   Alcohol use: No   Drug use: No   Sexual activity: Never  Other Topics Concern   Not on file  Social History Narrative   10th grade   Social Determinants of Health   Financial Resource Strain: Not on file  Food Insecurity: Not on file  Transportation Needs: Not on file  Physical Activity: Not on file  Stress: Not on file  Social Connections: Not on file  Intimate Partner Violence: Not on file   Family Status  Relation Name Status   MGM  Deceased   Brother 86 yo Alive   Mother  Alive   Father  Alive   MGF  Deceased   PGM  Alive   PGF  Alive   Other  (Not Specified)   Neg Hx  (Not Specified)    Family History  Problem Relation Age of Onset   Ulcers Maternal Grandmother    Breast cancer Maternal Grandmother    Asthma Maternal Grandmother    Dementia Maternal Grandmother    Healthy Brother    Healthy Mother    Healthy Father    Diabetes Maternal Grandfather    Heart disease Maternal Grandfather    Diabetes Paternal Grandmother    Uterine cancer Paternal Grandmother    Diabetes Other    Celiac disease Neg Hx    No Known Allergies  Patient Care Team: Virginia Crews, MD as PCP - General (Family Medicine)   Medications: Outpatient Medications Prior to Visit  Medication Sig   busPIRone (BUSPAR) 5 MG tablet Take 1 tablet (5 mg total) by mouth daily.   drospirenone-ethinyl estradiol (YAZ) 3-0.02 MG tablet Take 1 tablet by mouth daily. Please schedule office visit before any future refill.   hydrocortisone 1 % ointment Apply 1 application topically 2 (two) times daily. (Patient not taking: Reported on 04/25/2021)   methylphenidate (RITALIN) 5 MG tablet Take 1 tablet (5 mg total) by mouth every evening.   methylphenidate 18 MG PO CR tablet TAKE 1 TABLET BY MOUTH ONCE EVERY MORNING   No facility-administered medications prior to visit.    Review of  Systems  All other systems reviewed and are negative.   Last CBC Lab Results  Component Value Date   WBC 4.2 04/10/2021   HGB 12.2 04/10/2021   HCT 38.1 04/10/2021   MCV 79 04/10/2021   MCH 25.2 (L) 04/10/2021   RDW 16.6 (H) 04/10/2021   PLT 228 16/01/9603   Last metabolic panel Lab Results  Component Value Date   GLUCOSE 79 04/10/2021   NA 140 04/10/2021   K 4.2 04/10/2021   CL 102 04/10/2021   CO2 25 04/10/2021   BUN 13 04/10/2021   CREATININE 0.62 04/10/2021   EGFR 131 04/10/2021   CALCIUM 9.2 04/10/2021   PROT 6.5 04/10/2021   ALBUMIN 4.3 04/10/2021   LABGLOB 2.2 04/10/2021   AGRATIO 2.0 04/10/2021   BILITOT 0.2 04/10/2021   ALKPHOS 74 04/10/2021   AST 24 04/10/2021   ALT 21 04/10/2021        Objective    There were no vitals taken for this visit. BP Readings from Last 3 Encounters:  04/10/21 101/68  05/01/19 99/66 (8 %, Z = -1.41 /  53 %, Z = 0.08)*  02/27/19 112/77 (55 %, Z = 0.13 /  89 %, Z = 1.23)*   *BP percentiles are based on the 2017 AAP Clinical Practice Guideline for girls   Wt Readings from Last 3 Encounters:  04/10/21 165 lb (74.8 kg) (90 %, Z= 1.27)*  05/01/19 150 lb (68 kg) (85 %, Z= 1.03)*  02/27/19 159 lb 6.4 oz (72.3 kg) (90 %, Z= 1.28)*   * Growth percentiles are based on CDC (Girls, 2-20 Years) data.       Physical Exam  ***  Last depression screening scores    04/10/2021    1:53 PM 05/01/2019    3:22 PM 10/01/2018   11:27 AM  PHQ 2/9 Scores  PHQ - 2 Score 0 0 1  PHQ- 9 Score 0 0 3   Last fall risk screening     No data to display         Last Audit-C alcohol use screening    04/10/2021    1:53 PM  Alcohol Use Disorder Test (AUDIT)  1. How often do you have a drink containing alcohol? 0  2. How many drinks containing alcohol do you have on a typical day when you are drinking? 0  3. How often do you have six or more drinks on one occasion? 0  AUDIT-C Score 0   A score of 3 or more in women, and 4 or more in men indicates increased risk for alcohol abuse, EXCEPT if all of the points are from question 1   No results found for any visits on 03/15/22.  Assessment & Plan    Routine Health Maintenance and Physical Exam  Exercise Activities and Dietary recommendations  Goals   None     Immunization History  Administered Date(s) Administered   DTaP 10/07/2001, 12/04/2001, 02/19/2002, 11/05/2002, 08/22/2006   HIB (PRP-OMP) 10/07/2001, 12/04/2001, 02/19/2002, 08/25/2002   HPV 9-valent 07/23/2014, 11/26/2014   HPV Quadrivalent 05/12/2014   Hepatitis A 08/12/2008, 02/21/2009   Hepatitis B 12/26/2001, 02/19/2002, 11/05/2002   IPV 10/07/2001, 12/04/2001, 11/05/2002, 08/22/2006   Influenza,inj,Quad PF,6+ Mos 01/13/2015, 03/28/2017,  02/06/2018, 02/10/2019   MMR 08/25/2002, 08/22/2006   Meningococcal B, OMV 04/29/2018, 10/16/2018   Meningococcal Conjugate 10/08/2012   Meningococcal Mcv4o 04/29/2018   PFIZER(Purple Top)SARS-COV-2 Vaccination 07/14/2019, 08/04/2019   PPD Test 08/30/2020, 09/12/2020, 09/27/2020   Pneumococcal-Unspecified 12/04/2001, 02/19/2002, 11/05/2002, 03/24/2003  Tdap 10/08/2012   Varicella 08/25/2002, 08/22/2006    Health Maintenance  Topic Date Due   HIV Screening  Never done   Hepatitis C Screening  Never done   COVID-19 Vaccine (3 - Pfizer risk series) 09/01/2019   INFLUENZA VACCINE  10/31/2021   DTaP/Tdap/Td (7 - Td or Tdap) 10/09/2022   HPV VACCINES  Completed    Discussed health benefits of physical activity, and encouraged her to engage in regular exercise appropriate for her age and condition.  ***  No follow-ups on file.     {provider attestation***:1}   Lavon Paganini, MD  [facility] [phone] (phone) [phone] (fax)  [place]

## 2022-03-15 ENCOUNTER — Encounter: Payer: Self-pay | Admitting: Family Medicine

## 2022-03-15 DIAGNOSIS — Z0000 Encounter for general adult medical examination without abnormal findings: Secondary | ICD-10-CM

## 2022-03-20 ENCOUNTER — Telehealth (INDEPENDENT_AMBULATORY_CARE_PROVIDER_SITE_OTHER): Payer: BC Managed Care – PPO | Admitting: Family

## 2022-03-20 ENCOUNTER — Encounter: Payer: Self-pay | Admitting: Family

## 2022-03-20 DIAGNOSIS — Z79899 Other long term (current) drug therapy: Secondary | ICD-10-CM | POA: Diagnosis not present

## 2022-03-20 DIAGNOSIS — F900 Attention-deficit hyperactivity disorder, predominantly inattentive type: Secondary | ICD-10-CM | POA: Diagnosis not present

## 2022-03-20 DIAGNOSIS — Z719 Counseling, unspecified: Secondary | ICD-10-CM

## 2022-03-20 DIAGNOSIS — F411 Generalized anxiety disorder: Secondary | ICD-10-CM

## 2022-03-20 DIAGNOSIS — Z7189 Other specified counseling: Secondary | ICD-10-CM | POA: Diagnosis not present

## 2022-03-20 DIAGNOSIS — R41840 Attention and concentration deficit: Secondary | ICD-10-CM | POA: Diagnosis not present

## 2022-03-20 MED ORDER — METHYLPHENIDATE HCL ER (OSM) 18 MG PO TBCR: EXTENDED_RELEASE_TABLET | ORAL | 0 refills | Status: DC

## 2022-03-20 NOTE — Progress Notes (Signed)
[facility] [address], [place]. [address] [place] [address] Dept: [phone] Dept Fax: [phone]  Medication Check visit via Virtual Video   Patient ID:  Sandra Simmons  female DOB: December 20, 2000   21 y.o.   MRN: [id number]   DATE:03/20/22  PCP: Erasmo Downer, MD  Virtual Visit via Video Note I connected with  Meloney on 03/20/22 at  9:00 AM EST by a video enabled telemedicine application and verified that I am speaking with the correct person using two identifiers. Patient/Parent Location: at home  I discussed the limitations, risks, security and privacy concerns of performing an evaluation and management service by telephone and the availability of in person appointments. I also discussed with the parents that there may be a patient responsible charge related to this service. The parents expressed understanding and agreed to proceed.  Provider: Carron Curie, NP  Location: work location  HPI/CURRENT STATUS: Seri is here for medication management of the psychoactive medications for ADHD and review of educational and behavioral concerns.   Tierrah currently taking Concerta 18 mg daily, which is working well. Takes medication at breakfast. Medication tends to wear off around evening time and using Ritalin PRN. Johannah is able to focus through school and homework.   Hue is eating well (eating breakfast, lunch and dinner). Mollyann does not have appetite suppression and eating well. MVI three times/weekly.  Sleeping well (getting enough sleep each night), sleeping through the night. Analiah does not have delayed sleep onset and getting enough rest.  EDUCATION: School: [employer] Year/Grade:  college   Performance/ Grades: above average Services: Help as needed  Activities/ Exercise: daily and dance at school  MEDICAL HISTORY: Individual Medical History/ Review of Systems: Yes, recent blood work at [employer].  Has been healthy with no visits to the PCP. WCC due yearly.   Family Medical/ Social History: None Jenean Lives with: parents at home  MENTAL HEALTH: Mental Health Issues: None reported recently, anxiety at times, but nothing significant.     Allergies: No Known Allergies  Current Medications:  Current Outpatient Medications  Medication Instructions   busPIRone (BUSPAR) 5 mg, Oral, Daily   drospirenone-ethinyl estradiol (YAZ) 3-0.02 MG tablet 1 tablet, Oral, Daily, Please schedule office visit before any future refill.   hydrocortisone 1 % ointment 1 application , 2 times daily   methylphenidate (RITALIN) 5 mg, Oral, Every evening   methylphenidate 18 MG PO CR tablet TAKE 1 TABLET BY MOUTH ONCE EVERY MORNING  Medication Side Effects: None  DIAGNOSES:    ICD-10-CM   1. ADHD (attention deficit hyperactivity disorder), inattentive type  F90.0     2. Poor concentration  R41.840     3. GAD (generalized anxiety disorder)  F41.1     4. Medication management  Z79.899     5. Patient counseled  Z71.9     6. Goals of care, counseling/discussion  Z71.89     ASSESSMENT:      Sandra Simmons is a 21 year old female with a history of ADHD and Anxiety. She has been maintained on Concerta 18 mg daily and Ritalin 5 mg prn for homework. She has some efficacy for part of the day with the 18 mg and only taking the 5 mg Ritalin for homework time. Academically did well last semester with no formal services in place. Patient does report more anxiety when she doesn't take her medication. Not receiving counseling at this time. No changes  with eating, sleeping or health. Dancing and working out on a regular basis. Medication to continue with discussed for options for efficacy and longevity.   PLAN/RECOMMENDATIONS:  Updates with schooling and progression over the past semester.  NO formal services in place and no assistance last semester.  Staying active with dance and working out at the  gym.  Eating well and more calories daily due to the amount of exercise.   Discussed ongoing anxiety and when it is significant. Not currently using her Buspar.   Counseling at [employer] related to anxiety with dance team tryouts.  Sleeping well with no current concerns at home or school.   Medication management with current dosing and symptoms.    Discussed options for dose changes for efficacy during the day time. Will try BID dosing with her Concerta 18 mg or can take 1-2 of the Ritalin in the afternoon for coverage with classes and homework.   Counseled medication pharmacokinetics, options, dosage, administration, desired effects, and possible side effects.   Concerta 18 mg daily, #30 with no RF's Ritalin 5 mg daily, PRN, no Rx today Bupsar 5 mg PRN for anxiety, no Rx today RX for above e-scribed and sent to pharmacy on record  [facility] - [place], [place] - [address] [place] [address] Phone: [phone] Fax: [phone]  I discussed the assessment and treatment plan with Vernona Rieger. Audrena was provided an opportunity to ask questions and all were answered. Kaily agreed with the plan and demonstrated an understanding of the instructions.  REVIEW OF CHART, FACE TO FACE CLINIC TIME AND DOCUMENTATION TIME DURING TODAY'S VISIT:  40 mins      NEXT APPOINTMENT:  06/11/2022-f/u visit Telehealth OK  The parent was advised to call back or seek an in-person evaluation if the symptoms worsen or if the condition fails to improve as anticipated.  Carron Curie, NP

## 2022-04-12 ENCOUNTER — Other Ambulatory Visit: Payer: Self-pay

## 2022-04-13 MED ORDER — METHYLPHENIDATE HCL ER (OSM) 18 MG PO TBCR: EXTENDED_RELEASE_TABLET | ORAL | 0 refills | Status: DC

## 2022-04-13 NOTE — Telephone Encounter (Signed)
Concerta 18 mg daily, #62 with no RF's.RX for above e-scribed and sent to pharmacy on record  [id number] [place] [place] [place] [address] Phone: [phone] Fax: [phone]

## 2022-04-24 ENCOUNTER — Encounter: Payer: Self-pay | Admitting: Family Medicine

## 2022-05-10 ENCOUNTER — Other Ambulatory Visit: Payer: Self-pay

## 2022-05-10 MED ORDER — METHYLPHENIDATE HCL ER (OSM) 18 MG PO TBCR: 18.0000 mg | EXTENDED_RELEASE_TABLET | Freq: Every day | ORAL | 0 refills | Status: AC

## 2022-05-10 MED ORDER — METHYLPHENIDATE HCL ER (OSM) 18 MG PO TBCR: EXTENDED_RELEASE_TABLET | ORAL | 0 refills | Status: AC

## 2022-05-10 NOTE — Telephone Encounter (Signed)
Concerta 18 mg daily #53 with no RF's . 3 more Rx's post dated for 06/08/22, 07/09/22 & 08/08/22.RX for above e-scribed and sent to pharmacy on record  [id number] - [place], [place] - [address] [place] [address] Phone: [phone] Fax: [phone]

## 2022-05-17 ENCOUNTER — Encounter: Payer: Self-pay | Admitting: Family

## 2022-05-17 ENCOUNTER — Telehealth (INDEPENDENT_AMBULATORY_CARE_PROVIDER_SITE_OTHER): Payer: BC Managed Care – PPO | Admitting: Family

## 2022-05-17 DIAGNOSIS — Z8659 Personal history of other mental and behavioral disorders: Secondary | ICD-10-CM

## 2022-05-17 DIAGNOSIS — Z719 Counseling, unspecified: Secondary | ICD-10-CM

## 2022-05-17 DIAGNOSIS — F900 Attention-deficit hyperactivity disorder, predominantly inattentive type: Secondary | ICD-10-CM | POA: Diagnosis not present

## 2022-05-17 DIAGNOSIS — Z7189 Other specified counseling: Secondary | ICD-10-CM

## 2022-05-17 DIAGNOSIS — Z79899 Other long term (current) drug therapy: Secondary | ICD-10-CM

## 2022-05-17 DIAGNOSIS — F411 Generalized anxiety disorder: Secondary | ICD-10-CM

## 2022-05-17 NOTE — Progress Notes (Signed)
[facility]. [address] [address] Dept: [phone] Dept Fax: [phone]  Medication Check visit via Virtual Video   Patient ID:  Sandra Simmons  female DOB: 05-02-2000   22 y.o.   MRN: [id number]   DATE:05/17/22  PCP: Virginia Crews, MD  Virtual Visit via Video Note I connected with  Nilynn  on 05/17/22 at  1:00 PM EST by a video enabled telemedicine application and verified that I am speaking with the correct person using two identifiers. Patient/Parent Location: at school  I discussed the limitations, risks, security and privacy concerns of performing an evaluation and management service by telephone and the availability of in person appointments. I also discussed with the parents that there may be a patient responsible charge related to this service. The parents expressed understanding and agreed to proceed.  Provider: Carolann Littler, NP  Location: private work location  HPI/CURRENT STATUS: Eulala is here for medication management of the psychoactive medications for ADHD and review of educational and behavioral concerns.   Mareya currently taking Concerta 18 mg daily, which is working well. Takes medication at breakfast time daily. Medication tends to wear off around afternoon. Jersey is able to focus through school & homework.   Anngela is eating well (eating breakfast, lunch and dinner). Morley does not have appetite suppression and getting in calories each day.   Sleeping well (getting plenty of sleep each night, average 7-8 hours depending on the day), sleeping through the night. Erik does not have delayed sleep onset  EDUCATION: School: [employer] Year/Grade:  Junior in [employer] Grades: above average Services: help is available   Activities/ Exercise:  dance classes this semester and gym 3-4 times/week and studio time to work on technique.   MEDICAL  HISTORY: Individual Medical History/ Review of Systems: None.  Has been healthy with no visits to the PCP. [place] due yearly.   Family Medical/ Social History: None Miste Lives with:  roommates at school.  MENTAL HEALTH: Mental Health Issues:   Depression and Anxiety    Allergies: No Known Allergies  Current Medications:  Current Outpatient Medications  Medication Instructions   busPIRone (BUSPAR) 5 mg, Oral, Daily   drospirenone-ethinyl estradiol (YAZ) 3-0.02 MG tablet 1 tablet, Oral, Daily, Please schedule office visit before any future refill.   hydrocortisone 1 % ointment 1 application , 2 times daily   [START ON 06/08/2022] methylphenidate (CONCERTA) 18 mg, Oral, Daily   [START ON 07/09/2022] methylphenidate (CONCERTA) 18 mg, Oral, Daily   [START ON 08/08/2022] methylphenidate (CONCERTA) 18 mg, Oral, Daily   methylphenidate (RITALIN) 5 mg, Oral, Every evening   methylphenidate 18 MG PO CR tablet TAKE 1 TABLET BY MOUTH ONCE EVERY MORNING  Medication Side Effects: None  DIAGNOSES:    ICD-10-CM   1. ADHD (attention deficit hyperactivity disorder), inattentive type  F90.0     2. GAD (generalized anxiety disorder)  F41.1     3. History of depression  Z86.59     4. Medication management  Z79.899     5. Patient counseled  Z71.9     6. Goals of care, counseling/discussion  Z71.89     ASSESSMENT:    Sandra Simmons is a 22 year old female with a history of ADHD, Anxiety and Depression. Now well controlled on Concerta 18 mg some times 2 time daily depending on classes. Occasionally taking her Ritalin for studying at [employer]. Good efficacy with medications  and no side effects. Academically doing well with no reported issues No services in place but getting help with physics. Dance class and working out regularly. Eating well with no changes reported. Sleeping well with no reported concerns. Less anxiety and has not used the Buspar. No changes with medication or dosing.   PLAN/RECOMMENDATIONS:   Updates with school and progression for her Junior Year in college  No formals services in place but help is available on campus.  Has continued with dancing and working out, will try out for school dance team in May.   Eating well with no concerns and getting plenty to eat during the day.  Sleeping with no changes or concerns with 2nd dose of medication on occasion.   Medication management dicussed today with patient and no need for changes.  Discussed some longer days with BID dosing of Concerta 18 mg  Counseled medication pharmacokinetics, options, dosage, administration, desired effects, and possible side effects.   Concerta 18 mg daily no Rx today Ritalin 5 mg daily, no Rx today Buspar-NOT TAKING   I discussed the assessment and treatment plan with Sandra Simmons. Kinly was provided an opportunity to ask questions and all were answered. Sandra Simmons agreed with the plan and demonstrated an understanding of the instructions.  REVIEW OF CHART, FACE TO FACE CLINIC TIME AND DOCUMENTATION TIME DURING TODAY'S VISIT:  30 mins      NEXT APPOINTMENT: Return to PCP  The patient was advised to call back or seek an in-person evaluation if the symptoms worsen or if the condition fails to improve as anticipated.   Carolann Littler, NP

## 2022-06-01 DIAGNOSIS — H6502 Acute serous otitis media, left ear: Secondary | ICD-10-CM | POA: Diagnosis not present

## 2022-06-01 DIAGNOSIS — J069 Acute upper respiratory infection, unspecified: Secondary | ICD-10-CM | POA: Diagnosis not present

## 2022-06-11 ENCOUNTER — Telehealth: Payer: BC Managed Care – PPO | Admitting: Family

## 2022-07-10 DIAGNOSIS — Z025 Encounter for examination for participation in sport: Secondary | ICD-10-CM | POA: Diagnosis not present

## 2022-09-03 ENCOUNTER — Telehealth: Payer: Self-pay

## 2022-09-03 NOTE — Telephone Encounter (Signed)
LVM. Also sending mychart msg. AS, CMA 

## 2022-09-13 ENCOUNTER — Institutional Professional Consult (permissible substitution): Payer: BC Managed Care – PPO | Admitting: Family

## 2023-01-07 ENCOUNTER — Encounter: Payer: Self-pay | Admitting: Family Medicine

## 2023-01-07 ENCOUNTER — Other Ambulatory Visit (HOSPITAL_COMMUNITY)
Admission: RE | Admit: 2023-01-07 | Discharge: 2023-01-07 | Disposition: A | Discharge status: Home / Self Care | Payer: BC Managed Care – PPO | Source: Ambulatory Visit | Attending: Family Medicine | Admitting: Family Medicine

## 2023-01-07 ENCOUNTER — Ambulatory Visit (INDEPENDENT_AMBULATORY_CARE_PROVIDER_SITE_OTHER): Payer: BC Managed Care – PPO | Admitting: Family Medicine

## 2023-01-07 VITALS — BP 114/77 | HR 81 | Ht 68.0 in | Wt 163.2 lb

## 2023-01-07 DIAGNOSIS — Z1151 Encounter for screening for human papillomavirus (HPV): Secondary | ICD-10-CM | POA: Insufficient documentation

## 2023-01-07 DIAGNOSIS — L700 Acne vulgaris: Secondary | ICD-10-CM | POA: Diagnosis not present

## 2023-01-07 DIAGNOSIS — Z3009 Encounter for other general counseling and advice on contraception: Secondary | ICD-10-CM | POA: Diagnosis not present

## 2023-01-07 DIAGNOSIS — Z0000 Encounter for general adult medical examination without abnormal findings: Secondary | ICD-10-CM | POA: Diagnosis not present

## 2023-01-07 DIAGNOSIS — Z01419 Encounter for gynecological examination (general) (routine) without abnormal findings: Secondary | ICD-10-CM | POA: Diagnosis not present

## 2023-01-07 DIAGNOSIS — R87610 Atypical squamous cells of undetermined significance on cytologic smear of cervix (ASC-US): Secondary | ICD-10-CM | POA: Insufficient documentation

## 2023-01-07 DIAGNOSIS — R140 Abdominal distension (gaseous): Secondary | ICD-10-CM

## 2023-01-07 DIAGNOSIS — R87810 Cervical high risk human papillomavirus (HPV) DNA test positive: Secondary | ICD-10-CM | POA: Diagnosis not present

## 2023-01-07 DIAGNOSIS — Z124 Encounter for screening for malignant neoplasm of cervix: Secondary | ICD-10-CM | POA: Diagnosis not present

## 2023-01-07 DIAGNOSIS — R109 Unspecified abdominal pain: Secondary | ICD-10-CM | POA: Diagnosis not present

## 2023-01-07 NOTE — Progress Notes (Signed)
Complete physical exam  [name]   DOB: Feb 19, 2001   22 y.o. Female  MRN: [id number]  Subjective:    Chief Complaint  Patient presents with   Gynecologic Exam    Patient reports she is sexually active and has consented to std screening   Contraception    Patient would like to discuss birthcontrol. Not sur if she wants to restart her pills or start using the IUD   Annual Exam    Sandra Simmons is a 22 y.o. female who presents today for a complete physical exam. She reports consuming a general diet.  She generally feels well. She reports sleeping well. She does have additional problems to discuss today.   Discussed the use of AI scribe software for clinical note transcription with the patient, who gave verbal consent to proceed.  History of Present Illness   The patient, with a history of acne and potential food allergies, presents for a physical exam, Pap smear, and tetanus shot. They express concern about their persistent acne, which they suspect may be related to food allergies. They report having stomach issues in the past, which improved after cutting out bread and dairy. However, they have slowly reintroduced these foods back into their diet. The patient notes that their acne is not pustular but consists of small bumps under the skin. They also report bloating and a swollen stomach after consuming bread and sugar.  The patient has not been on any form of birth control for approximately a year and a half due to previous experiences of depression and suicidal thoughts when on birth control pills. They express interest in an intrauterine device (IUD) as a form of birth control, citing positive experiences from friends. The patient has been sexually inactive for the past year and a half and is not planning on having a baby anytime soon.  The patient also mentions a scheduled dermatologist appointment to discuss the possibility of starting Accutane for their acne.        Most  recent fall risk assessment:     No data to display           Most recent depression screenings:    04/10/2021    1:53 PM 05/01/2019    3:22 PM  PHQ 2/9 Scores  PHQ - 2 Score 0 0  PHQ- 9 Score 0 0        Patient Care Team: Erasmo Downer, MD as PCP - General (Family Medicine)   Outpatient Medications Prior to Visit  Medication Sig   methylphenidate (CONCERTA) 18 MG PO CR tablet Take 1 tablet (18 mg total) by mouth daily.   methylphenidate (CONCERTA) 18 MG PO CR tablet Take 1 tablet (18 mg total) by mouth daily.   methylphenidate (CONCERTA) 18 MG PO CR tablet Take 1 tablet (18 mg total) by mouth daily.   busPIRone (BUSPAR) 5 MG tablet Take 1 tablet (5 mg total) by mouth daily. (Patient not taking: Reported on 03/20/2022)   drospirenone-ethinyl estradiol (YAZ) 3-0.02 MG tablet Take 1 tablet by mouth daily. Please schedule office visit before any future refill. (Patient not taking: Reported on 05/17/2022)   hydrocortisone 1 % ointment Apply 1 application topically 2 (two) times daily. (Patient not taking: Reported on 04/25/2021)   methylphenidate (RITALIN) 5 MG tablet Take 1 tablet (5 mg total) by mouth every evening.   methylphenidate 18 MG PO CR tablet TAKE 1 TABLET BY MOUTH ONCE EVERY MORNING   No facility-administered medications prior  to visit.    ROS per HPI      Objective:     BP 114/77 (BP Location: Right Arm, Patient Position: Sitting, Cuff Size: Normal)   Pulse 81   Ht 5\' 8"  (1.727 m)   Wt 163 lb 3.2 oz (74 kg)   LMP 12/30/2022 (Approximate)   BMI 24.81 kg/m    Physical Exam Vitals reviewed.  Constitutional:      General: She is not in acute distress.    Appearance: Normal appearance. She is well-developed. She is not diaphoretic.  HENT:     Head: Normocephalic and atraumatic.     Right Ear: Tympanic membrane, ear canal and external ear normal.     Left Ear: Tympanic membrane, ear canal and external ear normal.     Nose: Nose normal.      Mouth/Throat:     Mouth: Mucous membranes are moist.     Pharynx: Oropharynx is clear. No oropharyngeal exudate.  Eyes:     General: No scleral icterus.    Conjunctiva/sclera: Conjunctivae normal.     Pupils: Pupils are equal, round, and reactive to light.  Neck:     Thyroid: No thyromegaly.  Cardiovascular:     Rate and Rhythm: Normal rate and regular rhythm.     Heart sounds: Normal heart sounds. No murmur heard. Pulmonary:     Effort: Pulmonary effort is normal. No respiratory distress.     Breath sounds: Normal breath sounds. No wheezing or rales.  Abdominal:     General: There is no distension.     Palpations: Abdomen is soft.     Tenderness: There is no abdominal tenderness.  Genitourinary:    Comments: GYN:  External genitalia within normal limits.  Vaginal mucosa pink, moist, normal rugae.  Nonfriable cervix without lesions, no discharge or bleeding noted on speculum exam.   Musculoskeletal:        General: No deformity.     Cervical back: Neck supple.     Right lower leg: No edema.     Left lower leg: No edema.  Lymphadenopathy:     Cervical: No cervical adenopathy.  Skin:    General: Skin is warm and dry.     Findings: No rash.  Neurological:     Mental Status: She is alert and oriented to person, place, and time. Mental status is at baseline.     Gait: Gait normal.  Psychiatric:        Mood and Affect: Mood normal.        Behavior: Behavior normal.        Thought Content: Thought content normal.      No results found for any visits on 01/07/23.     Assessment & Plan:    Routine Health Maintenance and Physical Exam  Immunization History  Administered Date(s) Administered   DTaP 10/07/2001, 12/04/2001, 02/19/2002, 11/05/2002, 08/22/2006   HIB (PRP-OMP) 10/07/2001, 12/04/2001, 02/19/2002, 08/25/2002   HPV 9-valent 07/23/2014, 11/26/2014   HPV Quadrivalent 05/12/2014   Hepatitis A 08/12/2008, 02/21/2009   Hepatitis A, Adult 08/12/2008   Hepatitis B  01/14/2002, 02/19/2002, 11/05/2002   IPV 10/07/2001, 12/04/2001, 11/05/2002, 08/22/2006   Influenza,inj,Quad PF,6+ Mos 01/13/2015, 03/28/2017, 02/06/2018, 02/10/2019   MMR 08/25/2002, 08/22/2006   Meningococcal B, OMV 04/29/2018, 10/16/2018   Meningococcal Conjugate 10/08/2012   Meningococcal Mcv4o 04/29/2018   PFIZER(Purple Top)SARS-COV-2 Vaccination 07/14/2019, 08/04/2019   PPD Test 08/30/2020, 09/12/2020, 09/27/2020   Pneumococcal-Unspecified 12/04/2001, 02/19/2002, 11/05/2002, 03/24/2003   Tdap 10/08/2012   Varicella 08/25/2002,  08/22/2006    Health Maintenance  Topic Date Due   HIV Screening  Never done   Hepatitis C Screening  Never done   COVID-19 Vaccine (3 - Pfizer risk series) 09/01/2019   Cervical Cancer Screening (Pap smear)  Never done   DTaP/Tdap/Td (7 - Td or Tdap) 10/09/2022   INFLUENZA VACCINE  07/01/2023 (Originally 11/01/2022)   HPV VACCINES  Completed    Discussed health benefits of physical activity, and encouraged her to engage in regular exercise appropriate for her age and condition.  Problem List Items Addressed This Visit       Musculoskeletal and Integument   Acne vulgaris     Other   Abdominal bloating with cramps   Relevant Orders   Ambulatory referral to Allergy   Other Visit Diagnoses     Encounter for annual physical exam    -  Primary   Contraceptive education       Relevant Orders   Ambulatory referral to Gynecology   Pap smear for cervical cancer screening       Relevant Orders   Cytology - PAP           Acne Persistent acne since adolescence, with recent worsening. Patient has a dermatology appointment scheduled next week and is considering Accutane. -Continue with dermatology appointment for further evaluation and management.  Possible Food Allergy Patient reports bloating and skin changes after consuming certain foods, particularly bread and sugar. No prior allergy testing. -Refer to Allergy for food allergy testing per  patient request.  Contraception Patient has been off birth control for approximately 1.5 years due to concerns about mood changes. Expressed interest in an IUD. -Refer to Gynecology for IUD placement.  General Health Maintenance -Perform Pap smear today. -Administer tetanus vaccine today. -Consider HIV and Hepatitis C screening at next lab draw. -Schedule physical for next year.        Return in about 1 year (around 01/07/2024) for CPE.     Shirlee Latch, MD

## 2023-01-08 ENCOUNTER — Telehealth: Payer: Self-pay

## 2023-01-08 ENCOUNTER — Encounter: Payer: Self-pay | Admitting: Family Medicine

## 2023-01-08 ENCOUNTER — Ambulatory Visit (INDEPENDENT_AMBULATORY_CARE_PROVIDER_SITE_OTHER): Payer: BC Managed Care – PPO | Admitting: Family Medicine

## 2023-01-08 DIAGNOSIS — Z124 Encounter for screening for malignant neoplasm of cervix: Secondary | ICD-10-CM

## 2023-01-08 NOTE — Telephone Encounter (Signed)
Patient has been scheduled for this afternoon for recollection

## 2023-01-08 NOTE — Progress Notes (Signed)
Specimen transport issue on pap sample after appt yesterday. Here today for re-collection No issues.

## 2023-01-15 LAB — CYTOLOGY - PAP
Chlamydia: NEGATIVE
Comment: NEGATIVE
Comment: NEGATIVE
Comment: NEGATIVE
Comment: NEGATIVE
Comment: NORMAL
Diagnosis: UNDETERMINED — AB
HPV 16: NEGATIVE
HPV 18 / 45: NEGATIVE
High risk HPV: POSITIVE — AB
Neisseria Gonorrhea: NEGATIVE

## 2023-01-16 ENCOUNTER — Ambulatory Visit: Payer: BC Managed Care – PPO | Admitting: Obstetrics

## 2023-01-16 ENCOUNTER — Encounter: Payer: Self-pay | Admitting: Obstetrics

## 2023-01-16 VITALS — BP 120/80 | Ht 68.0 in | Wt 161.0 lb

## 2023-01-16 DIAGNOSIS — Z3202 Encounter for pregnancy test, result negative: Secondary | ICD-10-CM | POA: Diagnosis not present

## 2023-01-16 DIAGNOSIS — Z30430 Encounter for insertion of intrauterine contraceptive device: Secondary | ICD-10-CM

## 2023-01-16 LAB — POCT URINE PREGNANCY: Preg Test, Ur: NEGATIVE

## 2023-01-16 MED ORDER — LEVONORGESTREL 19.5 MG IU IUD
INTRAUTERINE_SYSTEM | Freq: Once | INTRAUTERINE | Status: AC
Start: 2023-01-16 — End: 2023-01-16

## 2023-01-16 NOTE — Progress Notes (Signed)
    GYNECOLOGY PROGRESS NOTE  Subjective:    Patient ID: Sandra Simmons, female    DOB: 2000-09-07, 22 y.o.   MRN: [id number]  HPI  Patient is a 22 y.o. G0P0000 who presents for a IUD insertion. Pt states the last time she had intercourse was 1 month ago and she always uses condoms.  LMP 12/30/22. Is going back to school in [place] tomorrow. Used to be on OCPs but caused acne and is now about to start accutane with dermatology and needs to be on contraception. Recently had pap smear with her PCP.   The following portions of the patient's history were reviewed and updated as appropriate: allergies, current medications, past family history, past medical history, past social history, past surgical history, and problem list.  Review of Systems Pertinent items are noted in HPI.   Objective:   Blood pressure 120/80, height 5\' 8"  (1.727 m), weight 161 lb (73 kg), last menstrual period 12/30/2022. Body mass index is 24.48 kg/m.  General appearance: alert and cooperative Abdomen: soft, non-tender; bowel sounds normal; no masses,  no organomegaly Pelvic: cervix normal in appearance, external genitalia normal, no adnexal masses or tenderness, no cervical motion tenderness, rectovaginal septum normal, uterus normal size, shape, and consistency, and vagina normal without discharge Extremities: extremities normal, atraumatic, no cyanosis or edema Neurologic: Grossly normal   IUD INSERTION PROCEDURE NOTE  [name] is a 22 y.o. G0P0000 here for [place] IUD insertion. No GYN concerns.  Last pap smear was on 01/07/23 and was ASCUS w/HPV.   IUD Insertion Procedure Note Patient identified, informed consent performed, consent signed.   Discussed risks of irregular bleeding, cramping, infection, malpositioning or misplacement of the IUD outside the uterus which may require further procedure such as laparoscopy. Also discussed >99% contraception efficacy, increased risk of ectopic pregnancy with failure of method.    Emphasized that this did not protect against STIs, condoms recommended during all sexual encounters. Time out was performed.  Urine pregnancy test negative.  Speculum placed in the vagina.  Cervix visualized.  Cleaned with Betadine x 2.  Grasped anteriorly with a single tooth tenaculum.  Uterus sounded to 7 cm.  Kyleena IUD placed per manufacturer's recommendations.  Strings trimmed to 3 cm. Tenaculum was removed, good hemostasis noted.  Patient tolerated procedure well.   Patient was given post-procedure instructions.  She was advised to have backup contraception for one week.  Patient was also asked to check IUD strings periodically and follow up in 4 weeks for IUD check.   Assessment:   1. Encounter for IUD insertion      Plan:  22yo nullipara here for IUD insertion, Kyleena placed without complication.  -Backup method for the next 7 days -Aftercare instructions reviewed, handout provided. Aware IUD protects against pregnancy only, not STIs.  -Discussed how to perform string checks, if cannot find strings, RTC for exam. -Removal in 5 years, sooner if desires pregnancy.

## 2023-01-17 DIAGNOSIS — Z79899 Other long term (current) drug therapy: Secondary | ICD-10-CM | POA: Diagnosis not present

## 2023-01-17 DIAGNOSIS — L700 Acne vulgaris: Secondary | ICD-10-CM | POA: Diagnosis not present

## 2023-02-22 DIAGNOSIS — L700 Acne vulgaris: Secondary | ICD-10-CM | POA: Diagnosis not present

## 2023-02-22 DIAGNOSIS — Z79899 Other long term (current) drug therapy: Secondary | ICD-10-CM | POA: Diagnosis not present

## 2023-02-25 DIAGNOSIS — Z79899 Other long term (current) drug therapy: Secondary | ICD-10-CM | POA: Diagnosis not present

## 2023-02-27 DIAGNOSIS — R21 Rash and other nonspecific skin eruption: Secondary | ICD-10-CM | POA: Diagnosis not present

## 2023-02-27 DIAGNOSIS — J3089 Other allergic rhinitis: Secondary | ICD-10-CM | POA: Diagnosis not present

## 2023-02-27 DIAGNOSIS — J3081 Allergic rhinitis due to animal (cat) (dog) hair and dander: Secondary | ICD-10-CM | POA: Diagnosis not present

## 2023-02-27 DIAGNOSIS — J301 Allergic rhinitis due to pollen: Secondary | ICD-10-CM | POA: Diagnosis not present

## 2023-03-12 DIAGNOSIS — N3000 Acute cystitis without hematuria: Secondary | ICD-10-CM | POA: Diagnosis not present

## 2023-03-12 DIAGNOSIS — N760 Acute vaginitis: Secondary | ICD-10-CM | POA: Diagnosis not present

## 2023-03-12 DIAGNOSIS — Z118 Encounter for screening for other infectious and parasitic diseases: Secondary | ICD-10-CM | POA: Diagnosis not present

## 2023-04-24 DIAGNOSIS — L700 Acne vulgaris: Secondary | ICD-10-CM | POA: Diagnosis not present

## 2023-05-15 DIAGNOSIS — M25562 Pain in left knee: Secondary | ICD-10-CM | POA: Diagnosis not present

## 2023-05-21 DIAGNOSIS — M25562 Pain in left knee: Secondary | ICD-10-CM | POA: Diagnosis not present

## 2023-05-22 DIAGNOSIS — L700 Acne vulgaris: Secondary | ICD-10-CM | POA: Diagnosis not present

## 2023-05-22 DIAGNOSIS — L709 Acne, unspecified: Secondary | ICD-10-CM | POA: Diagnosis not present

## 2023-05-28 DIAGNOSIS — M25562 Pain in left knee: Secondary | ICD-10-CM | POA: Diagnosis not present

## 2023-06-04 DIAGNOSIS — M25562 Pain in left knee: Secondary | ICD-10-CM | POA: Diagnosis not present

## 2023-06-06 DIAGNOSIS — M25562 Pain in left knee: Secondary | ICD-10-CM | POA: Diagnosis not present

## 2023-06-13 DIAGNOSIS — F909 Attention-deficit hyperactivity disorder, unspecified type: Secondary | ICD-10-CM | POA: Diagnosis not present

## 2023-06-13 DIAGNOSIS — F419 Anxiety disorder, unspecified: Secondary | ICD-10-CM | POA: Diagnosis not present

## 2023-06-19 DIAGNOSIS — L700 Acne vulgaris: Secondary | ICD-10-CM | POA: Diagnosis not present

## 2023-07-05 DIAGNOSIS — H6501 Acute serous otitis media, right ear: Secondary | ICD-10-CM | POA: Diagnosis not present

## 2023-07-05 DIAGNOSIS — J0100 Acute maxillary sinusitis, unspecified: Secondary | ICD-10-CM | POA: Diagnosis not present

## 2023-07-22 DIAGNOSIS — J029 Acute pharyngitis, unspecified: Secondary | ICD-10-CM | POA: Diagnosis not present

## 2023-07-24 DIAGNOSIS — L700 Acne vulgaris: Secondary | ICD-10-CM | POA: Diagnosis not present

## 2023-07-24 DIAGNOSIS — Z79899 Other long term (current) drug therapy: Secondary | ICD-10-CM | POA: Diagnosis not present

## 2023-08-06 DIAGNOSIS — J020 Streptococcal pharyngitis: Secondary | ICD-10-CM | POA: Diagnosis not present

## 2023-08-06 DIAGNOSIS — N760 Acute vaginitis: Secondary | ICD-10-CM | POA: Diagnosis not present

## 2023-09-04 DIAGNOSIS — L700 Acne vulgaris: Secondary | ICD-10-CM | POA: Diagnosis not present

## 2023-09-11 DIAGNOSIS — F419 Anxiety disorder, unspecified: Secondary | ICD-10-CM | POA: Diagnosis not present

## 2023-09-11 DIAGNOSIS — F909 Attention-deficit hyperactivity disorder, unspecified type: Secondary | ICD-10-CM | POA: Diagnosis not present

## 2023-10-01 DIAGNOSIS — Z79899 Other long term (current) drug therapy: Secondary | ICD-10-CM | POA: Diagnosis not present

## 2023-10-02 DIAGNOSIS — L700 Acne vulgaris: Secondary | ICD-10-CM | POA: Diagnosis not present

## 2023-12-04 DIAGNOSIS — Z23 Encounter for immunization: Secondary | ICD-10-CM | POA: Diagnosis not present

## 2023-12-17 DIAGNOSIS — F900 Attention-deficit hyperactivity disorder, predominantly inattentive type: Secondary | ICD-10-CM | POA: Diagnosis not present

## 2023-12-17 DIAGNOSIS — F419 Anxiety disorder, unspecified: Secondary | ICD-10-CM | POA: Diagnosis not present

## 2023-12-17 DIAGNOSIS — F909 Attention-deficit hyperactivity disorder, unspecified type: Secondary | ICD-10-CM | POA: Diagnosis not present

## 2024-01-17 DIAGNOSIS — R100 Acute abdomen: Secondary | ICD-10-CM | POA: Diagnosis not present

## 2024-02-06 DIAGNOSIS — L0100 Impetigo, unspecified: Secondary | ICD-10-CM | POA: Diagnosis not present

## 2024-02-26 ENCOUNTER — Ambulatory Visit: Admitting: Obstetrics

## 2024-03-23 DIAGNOSIS — F419 Anxiety disorder, unspecified: Secondary | ICD-10-CM | POA: Diagnosis not present

## 2024-03-23 DIAGNOSIS — F900 Attention-deficit hyperactivity disorder, predominantly inattentive type: Secondary | ICD-10-CM | POA: Diagnosis not present
